# Patient Record
Sex: Male | Born: 1990 | Race: Black or African American | Hispanic: No | Marital: Single | Smoking: Never smoker
Health system: Southern US, Community
[De-identification: ages and names within clinical notes are randomized; demographics above are authoritative.]

---

## 2015-01-21 ENCOUNTER — Emergency Department (HOSPITAL_COMMUNITY): Payer: Self-pay

## 2015-01-21 ENCOUNTER — Emergency Department (HOSPITAL_COMMUNITY)
Admission: EM | Admit: 2015-01-21 | Discharge: 2015-01-21 | Disposition: A | Discharge status: Home / Self Care | Payer: Self-pay | Attending: Emergency Medicine | Admitting: Emergency Medicine

## 2015-01-21 ENCOUNTER — Encounter (HOSPITAL_COMMUNITY): Payer: Self-pay

## 2015-01-21 DIAGNOSIS — Y998 Other external cause status: Secondary | ICD-10-CM | POA: Insufficient documentation

## 2015-01-21 DIAGNOSIS — Y9389 Activity, other specified: Secondary | ICD-10-CM | POA: Insufficient documentation

## 2015-01-21 DIAGNOSIS — Z23 Encounter for immunization: Secondary | ICD-10-CM | POA: Insufficient documentation

## 2015-01-21 DIAGNOSIS — S0181XA Laceration without foreign body of other part of head, initial encounter: Secondary | ICD-10-CM | POA: Insufficient documentation

## 2015-01-21 DIAGNOSIS — Y9289 Other specified places as the place of occurrence of the external cause: Secondary | ICD-10-CM | POA: Insufficient documentation

## 2015-01-21 DIAGNOSIS — IMO0002 Reserved for concepts with insufficient information to code with codable children: Secondary | ICD-10-CM

## 2015-01-21 MED ORDER — TETANUS-DIPHTH-ACELL PERTUSSIS 5-2.5-18.5 LF-MCG/0.5 IM SUSP
0.5000 mL | Freq: Once | INTRAMUSCULAR | Status: AC
Start: 2015-01-21 — End: 2015-01-21
  Administered 2015-01-21: 0.5 mL via INTRAMUSCULAR
  Filled 2015-01-21: qty 0.5

## 2015-01-21 MED ORDER — HYDROCODONE-ACETAMINOPHEN 5-325 MG PO TABS
2.0000 | ORAL_TABLET | Freq: Once | ORAL | Status: AC
  Administered 2015-01-21: 2 via ORAL
  Filled 2015-01-21: qty 2

## 2015-01-21 NOTE — ED Notes (Signed)
The pt returned from xray  Waiting for c-t
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
## 2015-01-21 NOTE — ED Notes (Signed)
To x-ray
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
## 2015-01-21 NOTE — Discharge Instructions (Signed)
Laceration Care, Adult  Jerry Cruz, your CT scans do not show any significant injuries.  See a primary care physician in one week for wound check. If he cannot get an appointment come to emergency department. Use Tylenol or ibuprofen for pain control, if symptoms worsen come back to emergency department immediately. Thank you. A laceration is a cut that goes through all layers of the skin. The cut goes into the tissue beneath the skin. HOME CARE For stitches (sutures) or staples:  Keep the cut clean and dry.  If you have a bandage (dressing), change it at least once a day. Change the bandage if it gets wet or dirty, or as told by your doctor.  Wash the cut with soap and water 2 times a day. Rinse the cut with water. Pat it dry with a clean towel.  Put a thin layer of medicated cream on the cut as told by your doctor.  You may shower after the first 24 hours. Do not soak the cut in water until the stitches are removed.  Only take medicines as told by your doctor.  Have your stitches or staples removed as told by your doctor. For skin adhesive strips:  Keep the cut clean and dry.  Do not get the strips wet. You may take a bath, but be careful to keep the cut dry.  If the cut gets wet, pat it dry with a clean towel.  The strips will fall off on their own. Do not remove the strips that are still stuck to the cut. For wound glue:  You may shower or take baths. Do not soak or scrub the cut. Do not swim. Avoid heavy sweating until the glue falls off on its own. After a shower or bath, pat the cut dry with a clean towel.  Do not put medicine on your cut until the glue falls off.  If you have a bandage, do not put tape over the glue.  Avoid lots of sunlight or tanning lamps until the glue falls off. Put sunscreen on the cut for the first year to reduce your scar.  The glue will fall off on its own. Do not pick at the glue. You may need a tetanus shot if:  You cannot remember when you  had your last tetanus shot.  You have never had a tetanus shot. If you need a tetanus shot and you choose not to have one, you may get tetanus. Sickness from tetanus can be serious. GET HELP RIGHT AWAY IF:   Your pain does not get better with medicine.  Your arm, hand, leg, or foot loses feeling (numbness) or changes color.  Your cut is bleeding.  Your joint feels weak, or you cannot use your joint.  You have painful lumps on your body.  Your cut is red, puffy (swollen), or painful.  You have a red line on the skin near the cut.  You have yellowish-white fluid (pus) coming from the cut.  You have a fever.  You have a bad smell coming from the cut or bandage.  Your cut breaks open before or after stitches are removed.  You notice something coming out of the cut, such as wood or glass.  You cannot move a finger or toe. MAKE SURE YOU:   Understand these instructions.  Will watch your condition.  Will get help right away if you are not doing well or get worse. Document Released: 10/11/2007 Document Revised: 07/17/2011 Document Reviewed: 10/18/2010 [employer] Patient  Information 2015 [place], [place]. This information is not intended to replace advice given to you by your health care provider. Make sure you discuss any questions you have with your health care provider.
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
